# Patient Record
Sex: Female | Born: 1985 | Race: White | Hispanic: No | Marital: Single | Smoking: Never smoker
Health system: Southern US, Community
[De-identification: ages and names within clinical notes are randomized; demographics above are authoritative.]

## PROBLEM LIST (undated history)

## (undated) DIAGNOSIS — O1490 Unspecified pre-eclampsia, unspecified trimester: Secondary | ICD-10-CM

---

## 2010-08-19 ENCOUNTER — Emergency Department: Payer: Self-pay | Admitting: Emergency Medicine

## 2015-02-19 ENCOUNTER — Encounter: Payer: Self-pay | Admitting: Family Medicine

## 2015-02-19 DIAGNOSIS — M797 Fibromyalgia: Secondary | ICD-10-CM | POA: Insufficient documentation

## 2015-02-19 DIAGNOSIS — F1911 Other psychoactive substance abuse, in remission: Secondary | ICD-10-CM | POA: Insufficient documentation

## 2015-02-19 DIAGNOSIS — IMO0002 Reserved for concepts with insufficient information to code with codable children: Secondary | ICD-10-CM | POA: Insufficient documentation

## 2015-02-19 DIAGNOSIS — K219 Gastro-esophageal reflux disease without esophagitis: Secondary | ICD-10-CM | POA: Insufficient documentation

## 2017-11-27 ENCOUNTER — Emergency Department: Payer: No Typology Code available for payment source

## 2017-11-27 ENCOUNTER — Emergency Department
Admission: EM | Admit: 2017-11-27 | Discharge: 2017-11-27 | Disposition: A | Discharge status: Home / Self Care | Payer: No Typology Code available for payment source | Attending: Emergency Medicine | Admitting: Emergency Medicine

## 2017-11-27 DIAGNOSIS — S161XXA Strain of muscle, fascia and tendon at neck level, initial encounter: Secondary | ICD-10-CM | POA: Diagnosis not present

## 2017-11-27 DIAGNOSIS — Y92410 Unspecified street and highway as the place of occurrence of the external cause: Secondary | ICD-10-CM | POA: Insufficient documentation

## 2017-11-27 DIAGNOSIS — Z79899 Other long term (current) drug therapy: Secondary | ICD-10-CM | POA: Diagnosis not present

## 2017-11-27 DIAGNOSIS — Y999 Unspecified external cause status: Secondary | ICD-10-CM | POA: Diagnosis not present

## 2017-11-27 DIAGNOSIS — Y9389 Activity, other specified: Secondary | ICD-10-CM | POA: Diagnosis not present

## 2017-11-27 DIAGNOSIS — S199XXA Unspecified injury of neck, initial encounter: Secondary | ICD-10-CM | POA: Diagnosis present

## 2017-11-27 HISTORY — DX: Unspecified pre-eclampsia, unspecified trimester: O14.90

## 2017-11-27 MED ORDER — BACLOFEN 10 MG PO TABS: 10.0000 mg | ORAL_TABLET | Freq: Every day | ORAL | 1 refills | Status: AC

## 2017-11-27 MED ORDER — MELOXICAM 15 MG PO TABS: 15.0000 mg | ORAL_TABLET | Freq: Every day | ORAL | 2 refills | Status: AC

## 2017-11-27 NOTE — Discharge Instructions (Signed)
Follow-up with your regular doctor or Dr. Lajuana RippleMinns if you are not better in 7-10 days.  Use medication as prescribed.  Apply ice to all areas that hurt.  In 3 days she can apply wet heat followed by ice.  If you are worsening please return to emergency department
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
## 2022-05-29 ENCOUNTER — Encounter: Payer: Self-pay | Admitting: Obstetrics

## 2022-06-19 ENCOUNTER — Encounter: Payer: Medicaid Other | Admitting: Obstetrics
